# Patient Record
Sex: Female | Born: 2000 | Hispanic: No | Marital: Single | Smoking: Never smoker
Health system: Southern US, Community
[De-identification: ages and names within clinical notes are randomized; demographics above are authoritative.]

---
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
## 2020-07-01 DIAGNOSIS — B349 Viral infection, unspecified: Secondary | ICD-10-CM | POA: Diagnosis not present

## 2020-07-12 DIAGNOSIS — R059 Cough, unspecified: Secondary | ICD-10-CM | POA: Diagnosis not present

## 2020-10-11 DIAGNOSIS — Z711 Person with feared health complaint in whom no diagnosis is made: Secondary | ICD-10-CM | POA: Diagnosis not present

## 2020-10-11 DIAGNOSIS — Z03818 Encounter for observation for suspected exposure to other biological agents ruled out: Secondary | ICD-10-CM | POA: Diagnosis not present

## 2020-10-11 DIAGNOSIS — Z111 Encounter for screening for respiratory tuberculosis: Secondary | ICD-10-CM | POA: Diagnosis not present

## 2020-10-11 DIAGNOSIS — Z20822 Contact with and (suspected) exposure to covid-19: Secondary | ICD-10-CM | POA: Diagnosis not present

## 2020-10-13 DIAGNOSIS — Z111 Encounter for screening for respiratory tuberculosis: Secondary | ICD-10-CM | POA: Diagnosis not present

## 2020-10-20 DIAGNOSIS — Z111 Encounter for screening for respiratory tuberculosis: Secondary | ICD-10-CM | POA: Diagnosis not present

## 2020-10-22 DIAGNOSIS — Z111 Encounter for screening for respiratory tuberculosis: Secondary | ICD-10-CM | POA: Diagnosis not present

## 2021-02-09 DIAGNOSIS — Z23 Encounter for immunization: Secondary | ICD-10-CM | POA: Diagnosis not present

## 2022-02-26 ENCOUNTER — Ambulatory Visit: Payer: Self-pay | Admitting: Family Medicine

## 2022-03-06 ENCOUNTER — Ambulatory Visit: Payer: Self-pay | Admitting: Family Medicine

## 2022-04-06 ENCOUNTER — Encounter: Payer: Self-pay | Admitting: Family Medicine

## 2022-04-17 ENCOUNTER — Ambulatory Visit (INDEPENDENT_AMBULATORY_CARE_PROVIDER_SITE_OTHER): Payer: BC Managed Care – PPO | Admitting: Family Medicine

## 2022-04-17 ENCOUNTER — Encounter: Payer: Self-pay | Admitting: Family Medicine

## 2022-04-17 VITALS — BP 91/60 | HR 105 | Temp 98.5°F | Ht 60.5 in | Wt 108.0 lb

## 2022-04-17 DIAGNOSIS — Z7689 Persons encountering health services in other specified circumstances: Secondary | ICD-10-CM

## 2022-04-17 DIAGNOSIS — Z13220 Encounter for screening for lipoid disorders: Secondary | ICD-10-CM | POA: Diagnosis not present

## 2022-04-17 DIAGNOSIS — Z0000 Encounter for general adult medical examination without abnormal findings: Secondary | ICD-10-CM

## 2022-04-17 DIAGNOSIS — Z130 Encounter for screening for diseases of the blood and blood-forming organs and certain disorders involving the immune mechanism: Secondary | ICD-10-CM | POA: Diagnosis not present

## 2022-04-17 DIAGNOSIS — R000 Tachycardia, unspecified: Secondary | ICD-10-CM | POA: Diagnosis not present

## 2022-04-17 NOTE — Progress Notes (Signed)
Patient ID: Amy Marquez, female  DOB: 2000/12/20, 22 y.o.   MRN: [id number] Patient Care Team    Relationship Specialty Notifications Start End  Natalia Leatherwood, DO PCP - General Family Medicine  04/17/22     Chief Complaint  Patient presents with   Annual Exam   Establish Care    Subjective:  Amy Marquez is a 22 y.o.  female present for new patient establishment. All past medical history, surgical history, allergies, family history, immunizations, medications and social history were updated in the electronic medical record today. All recent labs, ED visits and hospitalizations within the last year were reviewed.  Health maintenance:  Colonoscopy: no fhx. Routine screen at 45 Mammogram: no fhx.  Routine screen at 40  Cervical cancer screening: start at 21> never SA.  Discussed gynecology establishment. Immunizations: tdap UTD 2023, Influenza UTD 2023 (encouraged yearly) Infectious disease screening: She has never been sexually active.  We discussed HIV, hepatitis C and urine cytology when she becomes sexually active. DEXA:routine screen Patient has a Dental home. Hospitalizations/ED visits: reviewed  Reports her menstrual cycles are routine approximately every 28-30 days, last about 4 days.  Menstrual cycle is not heavy.  She has never been sexually active.     04/17/2022    2:43 PM  Depression screen PHQ 2/9  Decreased Interest 0  Down, Depressed, Hopeless 0  PHQ - 2 Score 0       No data to display                    No data to display           Immunization History  Administered Date(s) Administered   BCG 10/25/2000   COVID-19, mRNA, vaccine(Comirnaty)12 years and older 02/19/2022   DTaP 09/10/2000, 11/26/2000, 01/24/2001, 12/12/2001, 09/26/2005   HIB (PRP-OMP) 09/10/2000, 05/10/2001, 07/11/2001, 10/29/2001   HPV 9-valent 02/10/2014, 05/19/2014, 10/22/2014   Hepatitis A, Ped/Adol-2 Dose 07/15/2007, 10/21/2008   Hepatitis B, PED/ADOLESCENT  14-Feb-2001, 09/10/2000, 12/26/2000, 12/28/2001   IPV 09/10/2000, 11/26/2000, 01/14/2001, 09/26/2005   Influenza Inj Mdck Quad Pf 12/29/2017   Influenza,inj,Quad PF,6+ Mos 01/06/2019   Influenza-Unspecified 12/28/2001, 01/06/2019, 12/31/2021   MMR 12/12/2001, 09/26/2005   Meningococcal B, OMV 03/24/2018   Meningococcal Conjugate 11/20/2011, 09/04/2017   PFIZER(Purple Top)SARS-COV-2 Vaccination 06/28/2019, 07/19/2019, 02/19/2020   PPD Test 10/11/2020, 10/20/2020   Pfizer Covid-19 Vaccine Bivalent Booster 40yrs & up 04/06/2021   Tdap 11/20/2011, 12/28/2021   Varicella 09/29/2001, 09/26/2005    No results found.  History reviewed. No pertinent past medical history. No Known Allergies History reviewed. No pertinent surgical history. Family History  Problem Relation Age of Onset   Arthritis Mother    Hypertension Father    Hyperlipidemia Father    Asthma Brother    Hypertension Maternal Grandmother    Heart disease Maternal Grandmother    Diabetes Maternal Grandmother    Hypertension Maternal Grandfather    COPD Maternal Grandfather    Diabetes Paternal Grandmother    Social History   Social History Narrative   Marital status/children/pets: Single   Education/employment: Bachelor's degree.  Employed as a Therapist, art.   Safety:      -smoke alarm in the home:Yes     - wears seatbelt: Yes     - Feels safe in their relationships: Yes       Allergies as of 04/17/2022   No Known Allergies      Medication List    as of April 17, 2022  2:46 PM   You have not been prescribed any medications.     All past medical history, surgical history, allergies, family history, immunizations andmedications were updated in the EMR today and reviewed under the history and medication portions of their EMR.    No results found for this or any previous visit (from the past 2160 hour(s)).  DG Chest 2 View  Result Date: 12/09/2008 Clinical Data: Wheezing, shortness of breath and cough.   CHEST - 2 VIEW  Comparison: None.  Findings: Trachea is midline.  Cardiothymic silhouette is within normal limits for size and contour.  Mild central airway thickening.  Lungs are clear.  No pleural fluid.  Visualized upper abdomen is unremarkable.  IMPRESSION: Mild central airway thickening can be seen with a viral process or reactive airways disease. Provider: Higinio Plan, Craig Staggers    ROS 14 pt review of systems performed and negative (unless mentioned in an HPI)  Objective:  BP 91/60   Pulse (!) 105   Temp 98.5 F (36.9 C) (Oral)   Ht 5' 0.5" (1.537 m)   Wt 108 lb (49 kg)   LMP 03/24/2022   SpO2 100%   BMI 20.75 kg/m  Physical Exam Vitals and nursing note reviewed.  Constitutional:      General: She is not in acute distress.    Appearance: Normal appearance. She is not ill-appearing or toxic-appearing.  HENT:     Head: Normocephalic and atraumatic.     Right Ear: Tympanic membrane, ear canal and external ear normal. There is no impacted cerumen.     Left Ear: Tympanic membrane, ear canal and external ear normal. There is no impacted cerumen.     Nose: No congestion or rhinorrhea.     Mouth/Throat:     Mouth: Mucous membranes are moist.     Pharynx: Oropharynx is clear. No oropharyngeal exudate or posterior oropharyngeal erythema.  Eyes:     General: No scleral icterus.       Right eye: No discharge.        Left eye: No discharge.     Extraocular Movements: Extraocular movements intact.     Conjunctiva/sclera: Conjunctivae normal.     Pupils: Pupils are equal, round, and reactive to light.  Cardiovascular:     Rate and Rhythm: Regular rhythm. Tachycardia present.     Pulses: Normal pulses.     Heart sounds: Normal heart sounds. No murmur heard.    No friction rub. No gallop.  Pulmonary:     Effort: Pulmonary effort is normal. No respiratory distress.     Breath sounds: Normal breath sounds. No stridor. No wheezing, rhonchi or rales.  Chest:     Chest wall: No  tenderness.  Abdominal:     General: Abdomen is flat. Bowel sounds are normal. There is no distension.     Palpations: Abdomen is soft. There is no mass.     Tenderness: There is no abdominal tenderness. There is no right CVA tenderness, left CVA tenderness, guarding or rebound.     Hernia: No hernia is present.  Musculoskeletal:        General: No swelling, tenderness or deformity. Normal range of motion.     Cervical back: Normal range of motion and neck supple. No rigidity or tenderness.     Right lower leg: No edema.     Left lower leg: No edema.  Lymphadenopathy:     Cervical: No cervical adenopathy.  Skin:    General:  Skin is warm and dry.     Coloration: Skin is not jaundiced or pale.     Findings: No bruising, erythema, lesion or rash.  Neurological:     General: No focal deficit present.     Mental Status: She is alert and oriented to person, place, and time. Mental status is at baseline.     Cranial Nerves: No cranial nerve deficit.     Sensory: No sensory deficit.     Motor: No weakness.     Coordination: Coordination normal.     Gait: Gait normal.     Deep Tendon Reflexes: Reflexes normal.  Psychiatric:        Mood and Affect: Mood normal.        Behavior: Behavior normal.        Thought Content: Thought content normal.        Judgment: Judgment normal.         Assessment/plan: Amy Marquez is a 22 y.o. female present for est care/CPE Establishing care with new doctor, encounter for Tachycardia Reassured. No symptoms. No palpitations. HR decreased below 100 once seated - Comprehensive metabolic panel - CBC - Lipid panel Lipid screening - Lipid panel Screening for deficiency anemia - CBC  Routine general medical examination at a health care facility - Comprehensive metabolic panel - CBC - Lipid panel Colonoscopy: no fhx. Routine screen at 45 Mammogram: no fhx.  Routine screen at 40  Cervical cancer screening: start at 21> never SA Immunizations: tdap  UTD 2023, Influenza UTD 2023 (encouraged yearly) Infectious disease screening: She has never been sexually active.  We discussed HIV, hepatitis C and urine cytology when she becomes sexually active. DEXA:routine screen Patient was encouraged to exercise greater than 150 minutes a week. Patient was encouraged to choose a diet filled with fresh fruits and vegetables, and lean meats. AVS provided to patient today for education/recommendation on gender specific health and safety maintenance.  No follow-ups on file.  No orders of the defined types were placed in this encounter.  No orders of the defined types were placed in this encounter.  Referral Orders  No referral(s) requested today     Note is dictated utilizing voice recognition software. Although note has been proof read prior to signing, occasional typographical errors still can be missed. If any questions arise, please do not hesitate to call for verification.  Electronically signed by: Howard Pouch, DO [place]

## 2022-04-17 NOTE — Patient Instructions (Signed)

## 2022-04-18 LAB — LIPID PANEL
Cholesterol: 181 mg/dL (ref <200)
HDL: 86 mg/dL (ref >=50)
LDL Cholesterol (Calc): 81 mg/dL (calc)
Non-HDL Cholesterol (Calc): 95 mg/dL (calc) (ref <130)
Total CHOL/HDL Ratio: 2.1 (calc) (ref <5.0)
Triglycerides: 62 mg/dL (ref <150)

## 2022-04-18 LAB — COMPREHENSIVE METABOLIC PANEL
AG Ratio: 1.4 (calc) (ref 1.0–2.5)
ALT: 9 U/L (ref 6–29)
AST: 18 U/L (ref 10–30)
Albumin: 4.1 g/dL (ref 3.6–5.1)
Alkaline phosphatase (APISO): 80 U/L (ref 31–125)
BUN: 10 mg/dL (ref 7–25)
CO2: 23 mmol/L (ref 20–32)
Calcium: 8.6 mg/dL (ref 8.6–10.2)
Chloride: 104 mmol/L (ref 98–110)
Creat: 0.59 mg/dL (ref 0.50–0.96)
Globulin: 2.9 g/dL (calc) (ref 1.9–3.7)
Glucose, Bld: 77 mg/dL (ref 65–99)
Potassium: 3.8 mmol/L (ref 3.5–5.3)
Sodium: 138 mmol/L (ref 135–146)
Total Bilirubin: 0.4 mg/dL (ref 0.2–1.2)
Total Protein: 7 g/dL (ref 6.1–8.1)

## 2022-04-18 LAB — CBC
HCT: 38.4 % (ref 35.0–45.0)
Hemoglobin: 12.9 g/dL (ref 11.7–15.5)
MCH: 29.9 pg (ref 27.0–33.0)
MCHC: 33.6 g/dL (ref 32.0–36.0)
MCV: 89.1 fL (ref 80.0–100.0)
MPV: 8.9 fL (ref 7.5–12.5)
Platelets: 237 10*3/uL (ref 140–400)
RBC: 4.31 10*6/uL (ref 3.80–5.10)
RDW: 12.8 % (ref 11.0–15.0)
WBC: 5.2 10*3/uL (ref 3.8–10.8)
